# Patient Record
Sex: Male | Born: 1995 | Race: Black or African American | Hispanic: No | Marital: Single | Smoking: Never smoker
Health system: Southern US, Community
[De-identification: ages and names within clinical notes are randomized; demographics above are authoritative.]

---

## 2015-11-26 ENCOUNTER — Emergency Department (HOSPITAL_COMMUNITY)
Admission: EM | Admit: 2015-11-26 | Discharge: 2015-11-26 | Disposition: A | Discharge status: Home / Self Care | Payer: Self-pay | Attending: Emergency Medicine | Admitting: Emergency Medicine

## 2015-11-26 ENCOUNTER — Encounter (HOSPITAL_COMMUNITY): Payer: Self-pay | Admitting: *Deleted

## 2015-11-26 DIAGNOSIS — Z23 Encounter for immunization: Secondary | ICD-10-CM | POA: Insufficient documentation

## 2015-11-26 DIAGNOSIS — T148XXA Other injury of unspecified body region, initial encounter: Secondary | ICD-10-CM

## 2015-11-26 DIAGNOSIS — Z79899 Other long term (current) drug therapy: Secondary | ICD-10-CM | POA: Insufficient documentation

## 2015-11-26 DIAGNOSIS — S71111A Laceration without foreign body, right thigh, initial encounter: Secondary | ICD-10-CM

## 2015-11-26 DIAGNOSIS — Y999 Unspecified external cause status: Secondary | ICD-10-CM | POA: Insufficient documentation

## 2015-11-26 DIAGNOSIS — Y939 Activity, unspecified: Secondary | ICD-10-CM | POA: Insufficient documentation

## 2015-11-26 DIAGNOSIS — Y929 Unspecified place or not applicable: Secondary | ICD-10-CM | POA: Insufficient documentation

## 2015-11-26 MED ORDER — TRIPLE ANTIBIOTIC 5-400-5000 EX OINT: TOPICAL_OINTMENT | Freq: Four times a day (QID) | CUTANEOUS | Status: AC

## 2015-11-26 MED ORDER — TETANUS-DIPHTH-ACELL PERTUSSIS 5-2.5-18.5 LF-MCG/0.5 IM SUSP
0.5000 mL | Freq: Once | INTRAMUSCULAR | Status: AC
  Administered 2015-11-26: 0.5 mL via INTRAMUSCULAR
  Filled 2015-11-26: qty 0.5

## 2015-11-26 NOTE — Discharge Instructions (Signed)
We saw you in the ER for your WOUND. Please read the instructions provided on wound care. Keep the area clean and dry, apply bacitracin ointment daily and take the medications provided. RETURN TO THE ER IF THERE IS INCREASED PAIN, REDNESS, PUS COMING OUT from the wound site.   Laceration Care, Adult A laceration is a cut that goes through all of the layers of the skin and into the tissue that is right under the skin. Some lacerations heal on their own. Others need to be closed with stitches (sutures), staples, skin adhesive strips, or skin glue. Proper laceration care minimizes the risk of infection and helps the laceration to heal better. HOW TO CARE FOR YOUR LACERATION If sutures or staples were used:  Keep the wound clean and dry.  If you were given a bandage (dressing), you should change it at least one time per day or as told by your health care provider. You should also change it if it becomes wet or dirty.  Keep the wound completely dry for the first 24 hours or as told by your health care provider. After that time, you may shower or bathe. However, make sure that the wound is not soaked in water until after the sutures or staples have been removed.  Clean the wound one time each day or as told by your health care provider:  Wash the wound with soap and water.  Rinse the wound with water to remove all soap.  Pat the wound dry with a clean towel. Do not rub the wound.  After cleaning the wound, apply a thin layer of antibiotic ointmentas told by your health care provider. This will help to prevent infection and keep the dressing from sticking to the wound.  Have the sutures or staples removed as told by your health care provider. If skin adhesive strips were used:  Keep the wound clean and dry.  If you were given a bandage (dressing), you should change it at least one time per day or as told by your health care provider. You should also change it if it becomes dirty or  wet.  Do not get the skin adhesive strips wet. You may shower or bathe, but be careful to keep the wound dry.  If the wound gets wet, pat it dry with a clean towel. Do not rub the wound.  Skin adhesive strips fall off on their own. You may trim the strips as the wound heals. Do not remove skin adhesive strips that are still stuck to the wound. They will fall off in time. If skin glue was used:  Try to keep the wound dry, but you may briefly wet it in the shower or bath. Do not soak the wound in water, such as by swimming.  After you have showered or bathed, gently pat the wound dry with a clean towel. Do not rub the wound.  Do not do any activities that will make you sweat heavily until the skin glue has fallen off on its own.  Do not apply liquid, cream, or ointment medicine to the wound while the skin glue is in place. Using those may loosen the film before the wound has healed.  If you were given a bandage (dressing), you should change it at least one time per day or as told by your health care provider. You should also change it if it becomes dirty or wet.  If a dressing is placed over the wound, be careful not to apply tape  directly over the skin glue. Doing that may cause the glue to be pulled off before the wound has healed.  Do not pick at the glue. The skin glue usually remains in place for 5-10 days, then it falls off of the skin. General Instructions  Take over-the-counter and prescription medicines only as told by your health care provider.  If you were prescribed an antibiotic medicine or ointment, take or apply it as told by your doctor. Do not stop using it even if your condition improves.  To help prevent scarring, make sure to cover your wound with sunscreen whenever you are outside after stitches are removed, after adhesive strips are removed, or when glue remains in place and the wound is healed. Make sure to wear a sunscreen of at least 30 SPF.  Do not scratch or  pick at the wound.  Keep all follow-up visits as told by your health care provider. This is important.  Check your wound every day for signs of infection. Watch for:  Redness, swelling, or pain.  Fluid, blood, or pus.  Raise (elevate) the injured area above the level of your heart while you are sitting or lying down, if possible. SEEK MEDICAL CARE IF:  You received a tetanus shot and you have swelling, severe pain, redness, or bleeding at the injection site.  You have a fever.  A wound that was closed breaks open.  You notice a bad smell coming from your wound or your dressing.  You notice something coming out of the wound, such as wood or glass.  Your pain is not controlled with medicine.  You have increased redness, swelling, or pain at the site of your wound.  You have fluid, blood, or pus coming from your wound.  You notice a change in the color of your skin near your wound.  You need to change the dressing frequently due to fluid, blood, or pus draining from the wound.  You develop a new rash.  You develop numbness around the wound. SEEK IMMEDIATE MEDICAL CARE IF:  You develop severe swelling around the wound.  Your pain suddenly increases and is severe.  You develop painful lumps near the wound or on skin that is anywhere on your body.  You have a red streak going away from your wound.  The wound is on your hand or foot and you cannot properly move a finger or toe.  The wound is on your hand or foot and you notice that your fingers or toes look pale or bluish.   This information is not intended to replace advice given to you by your health care provider. Make sure you discuss any questions you have with your health care provider.   Document Released: 07/08/2005 Document Revised: 11/22/2014 Document Reviewed: 07/04/2014 Elsevier Interactive Patient Education [employer].
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
## 2016-09-28 ENCOUNTER — Encounter (HOSPITAL_COMMUNITY): Payer: Self-pay | Admitting: *Deleted

## 2016-09-28 ENCOUNTER — Ambulatory Visit (HOSPITAL_COMMUNITY)
Admission: EM | Admit: 2016-09-28 | Discharge: 2016-09-28 | Disposition: A | Discharge status: Home / Self Care | Payer: Self-pay | Attending: Internal Medicine | Admitting: Internal Medicine

## 2016-09-28 DIAGNOSIS — L233 Allergic contact dermatitis due to drugs in contact with skin: Secondary | ICD-10-CM

## 2016-09-28 DIAGNOSIS — T7840XA Allergy, unspecified, initial encounter: Secondary | ICD-10-CM

## 2016-09-28 MED ORDER — HYDROXYZINE HCL 25 MG PO TABS: 25.0000 mg | ORAL_TABLET | Freq: Four times a day (QID) | ORAL | 0 refills | Status: AC

## 2016-09-28 MED ORDER — METHYLPREDNISOLONE 4 MG PO TBPK: ORAL_TABLET | ORAL | 0 refills | Status: AC

## 2016-09-28 NOTE — ED Provider Notes (Signed)
CSN: [id number]     Arrival date & time 09/28/16  1439 History   None    Chief Complaint  Patient presents with  . Facial Swelling   (Consider location/radiation/quality/duration/timing/severity/associated sxs/prior Treatment) Patient was using blistex a couple days ago and now has rash around mouth and itching.   The history is provided by the patient.  Rash  Location:  Face Facial rash location:  Face, upper lip and lower lip Quality: itchiness and redness   Onset quality:  Sudden Duration:  2 days Timing:  Constant Progression:  Worsening Chronicity:  New Context: medications   Relieved by:  None tried Worsened by:  Nothing Ineffective treatments:  None tried   History reviewed. No pertinent past medical history. History reviewed. No pertinent surgical history. No family history on file. Social History  Substance Use Topics  . Smoking status: Never Smoker  . Smokeless tobacco: Not on file  . Alcohol use No    Review of Systems  Constitutional: Negative.   HENT: Negative.   Eyes: Negative.   Respiratory: Negative.   Cardiovascular: Negative.   Gastrointestinal: Negative.   Endocrine: Negative.   Genitourinary: Negative.   Musculoskeletal: Negative.   Skin: Positive for rash.  Allergic/Immunologic: Negative.   Neurological: Negative.   Hematological: Negative.   Psychiatric/Behavioral: Negative.     Allergies  Patient has no known allergies.  Home Medications   Prior to Admission medications   Medication Sig Start Date End Date Taking? Authorizing Provider  hydrOXYzine (ATARAX/VISTARIL) 25 MG tablet Take 1 tablet (25 mg total) by mouth every 6 (six) hours. 09/28/16   Deatra CanterWilliam J Oxford, FNP  methylPREDNISolone (MEDROL DOSEPAK) 4 MG TBPK tablet Take 6-5-4-3-2-1 po qd 09/28/16   Deatra CanterWilliam J Oxford, FNP  neomycin-bacitracin-polymyxin (NEOSPORIN) [phone] ointment Apply topically 4 (four) times daily. 11/26/15   Derwood KaplanAnkit Nanavati, MD   Meds Ordered and  Administered this Visit  Medications - No data to display  BP 139/80   Pulse 72   Temp 98.6 F (37 C) (Oral)   Resp 16   SpO2 98%  No data found.   Physical Exam  Constitutional: He appears well-developed and well-nourished.  HENT:  Head: Normocephalic and atraumatic.  Rash around mouth with itching  Eyes: Conjunctivae and EOM are normal. Pupils are equal, round, and reactive to light.  Neck: Normal range of motion. Neck supple.  Cardiovascular: Normal rate, regular rhythm and normal heart sounds.   Pulmonary/Chest: Effort normal and breath sounds normal.  Abdominal: Soft. Bowel sounds are normal. He exhibits no distension.  Nursing note and vitals reviewed.   Urgent Care Course     Procedures (including critical care time)  Labs Review Labs Reviewed - No data to display  Imaging Review No results found.   Visual Acuity Review  Right Eye Distance:   Left Eye Distance:   Bilateral Distance:    Right Eye Near:   Left Eye Near:    Bilateral Near:         MDM   1. Allergic reaction, initial encounter   2. Allergic contact dermatitis due to drugs in contact with skin    Medrol does pack as directed 4mg  #21 Take benadryl over the counter as directed      Deatra CanterWilliam J Oxford, FNP 09/28/16 1709

## 2016-09-28 NOTE — ED Triage Notes (Signed)
Reports circumoral swelling, burning and tightness x "few days".  Denies any actual lip swelling, internal oral swelling or throat swelling.  Believes he is reacting to Blistex lip balm.

## 2017-02-18 ENCOUNTER — Encounter (HOSPITAL_COMMUNITY): Payer: Self-pay | Admitting: Nurse Practitioner

## 2017-02-18 ENCOUNTER — Emergency Department (HOSPITAL_COMMUNITY)
Admission: EM | Admit: 2017-02-18 | Discharge: 2017-02-18 | Disposition: A | Discharge status: Home / Self Care | Payer: Self-pay | Attending: Emergency Medicine | Admitting: Emergency Medicine

## 2017-02-18 ENCOUNTER — Emergency Department (HOSPITAL_COMMUNITY): Payer: Self-pay

## 2017-02-18 DIAGNOSIS — M25559 Pain in unspecified hip: Secondary | ICD-10-CM

## 2017-02-18 DIAGNOSIS — Z79899 Other long term (current) drug therapy: Secondary | ICD-10-CM | POA: Insufficient documentation

## 2017-02-18 DIAGNOSIS — M25562 Pain in left knee: Secondary | ICD-10-CM | POA: Insufficient documentation

## 2017-02-18 DIAGNOSIS — M25551 Pain in right hip: Secondary | ICD-10-CM | POA: Insufficient documentation

## 2017-02-18 NOTE — Discharge Instructions (Signed)
Please read attached information. If you experience any new or worsening signs or symptoms please return to the emergency room for evaluation. Please follow-up with your primary care provider or specialist as discussed.  Please use ibuprofen as directed for pain.
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
## 2017-02-18 NOTE — ED Notes (Signed)
Pt sts to xray staff he has left knee pain. Denies right knee pain

## 2017-02-18 NOTE — ED Triage Notes (Signed)
Pt endorses lower back pain, right hip pain and right knee pain been ongoing for 3 weeks. No pain at rest but painful with movement and bending at work. Pt denies injury. Ambulatory with steady gait to triage. No obvious deformity or bruising noted. No paresthesias or bowel/bladder loss.

## 2017-03-04 ENCOUNTER — Encounter (HOSPITAL_COMMUNITY): Payer: Self-pay

## 2017-03-04 ENCOUNTER — Emergency Department (HOSPITAL_COMMUNITY)
Admission: EM | Admit: 2017-03-04 | Discharge: 2017-03-04 | Disposition: A | Discharge status: Home / Self Care | Payer: No Typology Code available for payment source | Attending: Emergency Medicine | Admitting: Emergency Medicine

## 2017-03-04 DIAGNOSIS — M545 Low back pain, unspecified: Secondary | ICD-10-CM

## 2017-03-04 DIAGNOSIS — M25552 Pain in left hip: Secondary | ICD-10-CM | POA: Diagnosis not present

## 2017-03-04 DIAGNOSIS — Z79899 Other long term (current) drug therapy: Secondary | ICD-10-CM | POA: Insufficient documentation

## 2017-03-04 DIAGNOSIS — M25551 Pain in right hip: Secondary | ICD-10-CM

## 2017-03-04 MED ORDER — IBUPROFEN 600 MG PO TABS: 600.0000 mg | ORAL_TABLET | Freq: Four times a day (QID) | ORAL | 0 refills | Status: DC | PRN

## 2017-03-04 MED ORDER — METHOCARBAMOL 500 MG PO TABS: 500.0000 mg | ORAL_TABLET | Freq: Two times a day (BID) | ORAL | 0 refills | Status: AC

## 2017-03-04 NOTE — Discharge Instructions (Signed)
Please read attached information. If you experience any new or worsening signs or symptoms please return to the emergency room for evaluation. Please follow-up with your primary care provider or specialist as discussed. Please use medication prescribed only as directed and discontinue taking if you have any concerning signs or symptoms.   °

## 2017-03-04 NOTE — ED Notes (Signed)
Pt family given warm blanket
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
## 2017-03-12 ENCOUNTER — Encounter (HOSPITAL_COMMUNITY): Payer: Self-pay | Admitting: *Deleted

## 2017-03-12 ENCOUNTER — Emergency Department (HOSPITAL_COMMUNITY)
Admission: EM | Admit: 2017-03-12 | Discharge: 2017-03-12 | Disposition: A | Discharge status: Home / Self Care | Payer: No Typology Code available for payment source | Attending: Emergency Medicine | Admitting: Emergency Medicine

## 2017-03-12 DIAGNOSIS — M25512 Pain in left shoulder: Secondary | ICD-10-CM | POA: Diagnosis not present

## 2017-03-12 DIAGNOSIS — M25551 Pain in right hip: Secondary | ICD-10-CM | POA: Diagnosis present

## 2017-03-12 MED ORDER — IBUPROFEN 600 MG PO TABS
600.0000 mg | ORAL_TABLET | Freq: Four times a day (QID) | ORAL | 0 refills | Status: AC | PRN
Start: 2017-03-12 — End: ?
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
## 2017-03-12 NOTE — Discharge Instructions (Signed)
Please read attached information. If you experience any new or worsening signs or symptoms please return to the emergency room for evaluation. Please follow-up with your primary care provider or specialist as discussed. Please use medication prescribed only as directed and discontinue taking if you have any concerning signs or symptoms.   °

## 2017-03-12 NOTE — ED Notes (Signed)
Pt verbalizes DC teaching, NAD. Pt also given resources for orthopedist and [facility] to establish a PCP.

## 2017-03-18 ENCOUNTER — Emergency Department (HOSPITAL_COMMUNITY)
Admission: EM | Admit: 2017-03-18 | Discharge: 2017-03-18 | Discharge status: Against Medical Advice | Payer: Self-pay | Attending: Emergency Medicine | Admitting: Emergency Medicine

## 2017-03-18 ENCOUNTER — Emergency Department (HOSPITAL_COMMUNITY): Payer: Self-pay

## 2017-03-18 ENCOUNTER — Encounter (HOSPITAL_COMMUNITY): Payer: Self-pay | Admitting: Emergency Medicine

## 2017-03-18 DIAGNOSIS — Z5321 Procedure and treatment not carried out due to patient leaving prior to being seen by health care provider: Secondary | ICD-10-CM | POA: Insufficient documentation

## 2017-03-18 DIAGNOSIS — R1084 Generalized abdominal pain: Secondary | ICD-10-CM | POA: Insufficient documentation

## 2017-03-18 LAB — URINALYSIS, ROUTINE W REFLEX MICROSCOPIC
Bilirubin Urine: NEGATIVE
GLUCOSE, UA: NEGATIVE mg/dL
Hgb urine dipstick: NEGATIVE
KETONES UR: NEGATIVE mg/dL
LEUKOCYTES UA: NEGATIVE
Nitrite: NEGATIVE
PROTEIN: NEGATIVE mg/dL
Specific Gravity, Urine: 1.024 (ref 1.005–1.030)
pH: 7 (ref 5.0–8.0)

## 2017-03-18 NOTE — ED Notes (Signed)
Unable to locate in lobby, LWBS.

## 2017-03-18 NOTE — ED Notes (Signed)
Called for room X2, no answer 
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
## 2019-07-17 IMAGING — DX DG CHEST 2V
2 series · 2 of 2 positions shown · non-contrast
Comparison: None.

CLINICAL DATA: Right lower chest pain for 2 days.

EXAM:
CHEST  2 VIEW

[chest pa]
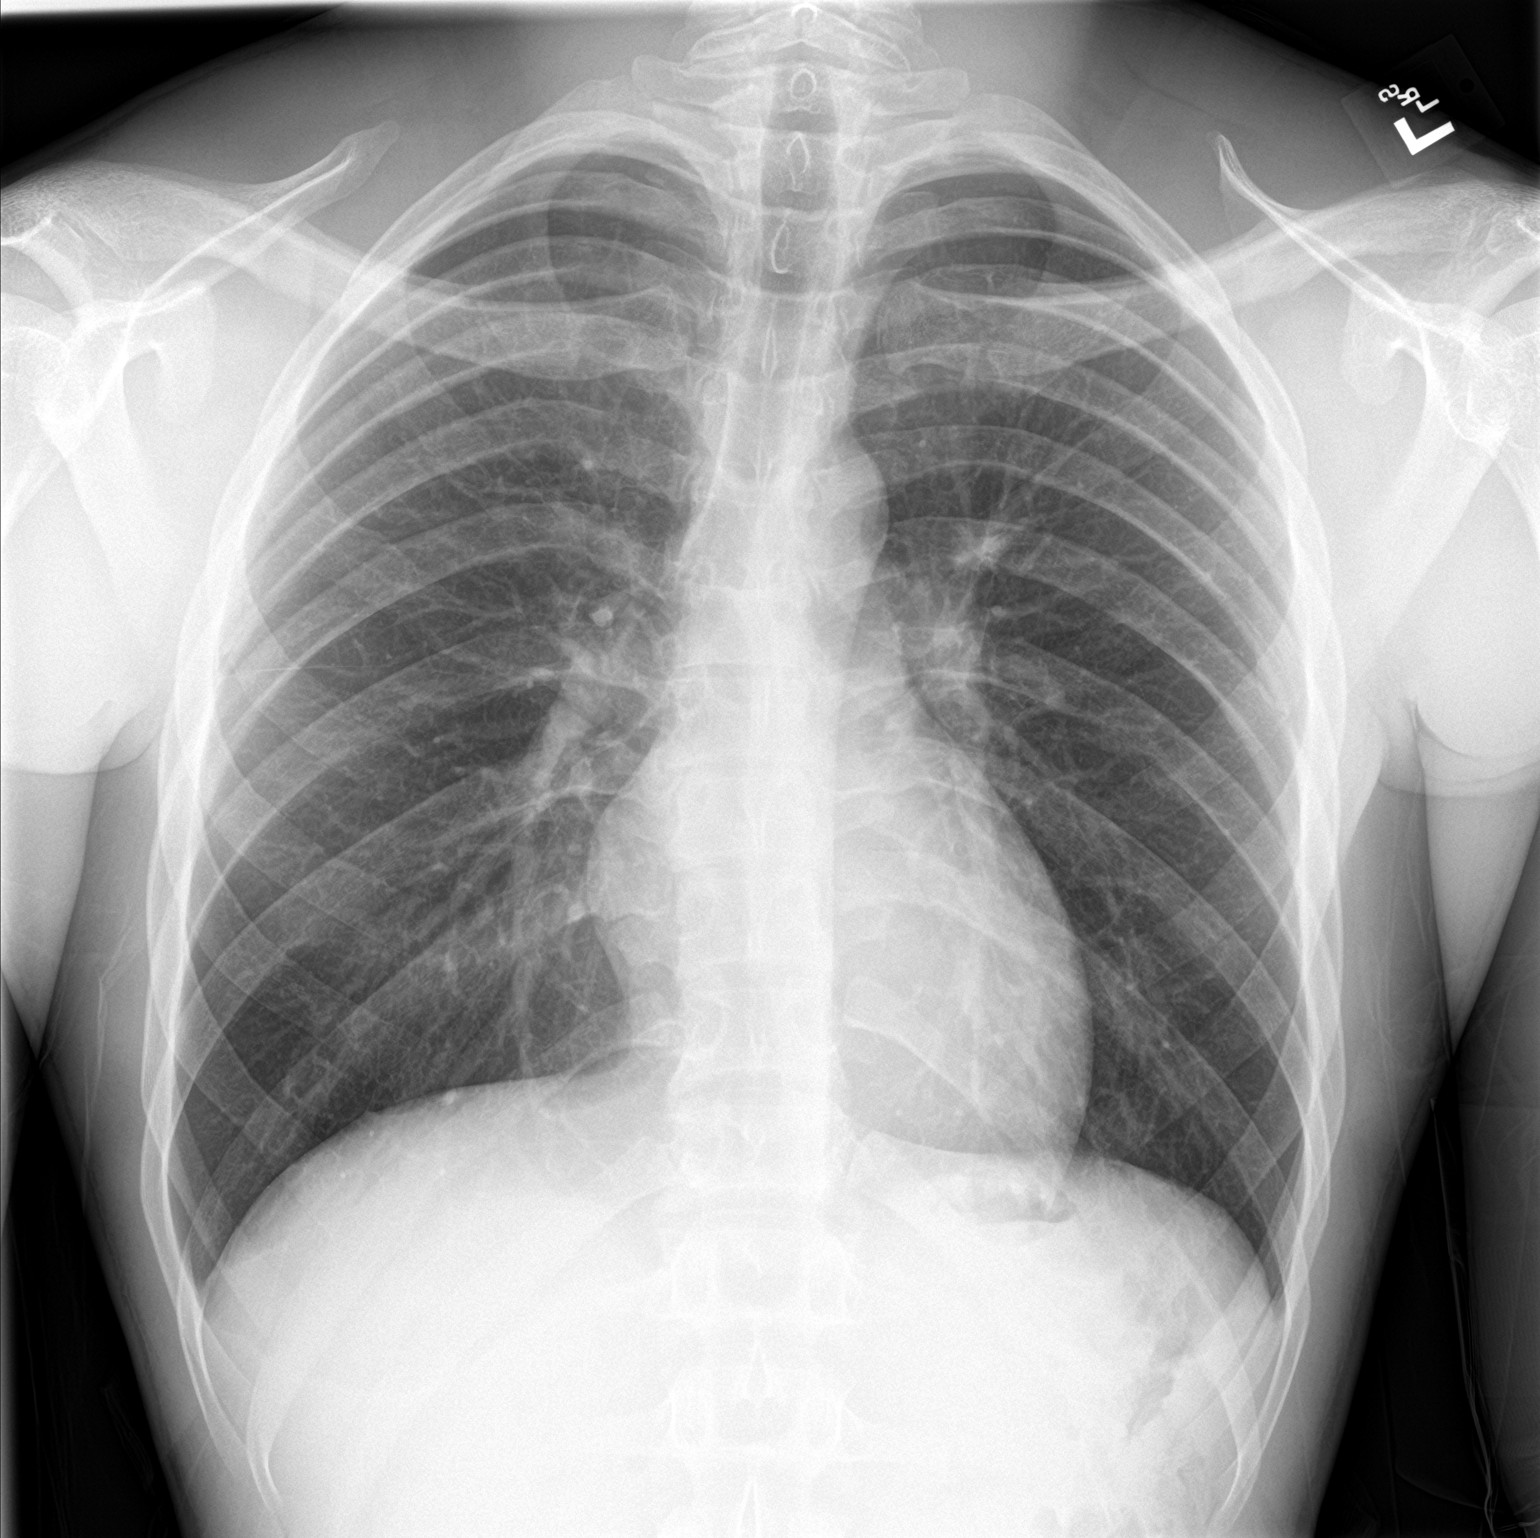

[chest lat]
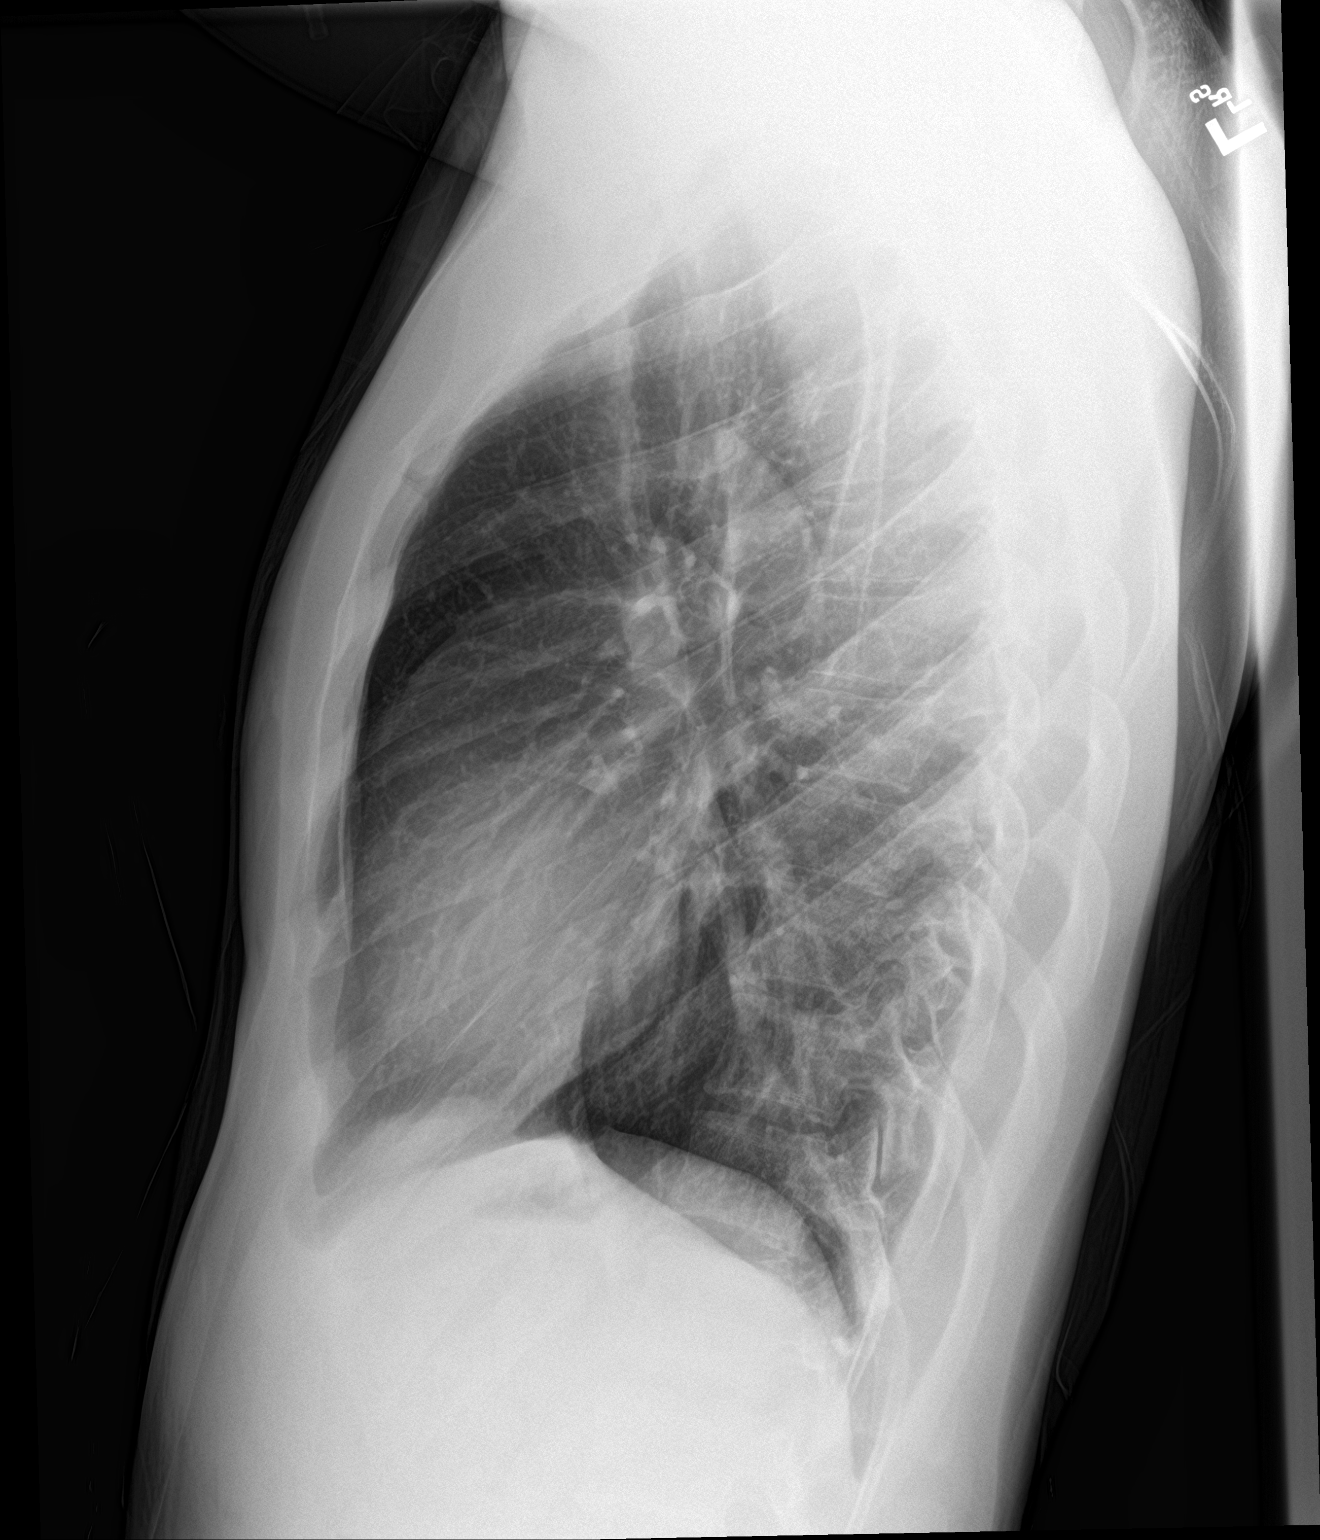

[2 of 2 positions shown; findings below may reference images not displayed]

FINDINGS: The heart size and mediastinal contours are within normal limits.
There is no focal infiltrate, pulmonary edema, or pleural effusion.
There is a questioned nodule in the left suprahilar region. The
visualized skeletal structures are unremarkable.
IMPRESSION: No active cardiopulmonary disease.

Question nodule in the left suprahilar region. Consider further
evaluation with chest CT on outpatient basis.
